# Patient Record
Sex: Male | Born: 1960 | Race: White | Hispanic: No | Marital: Married
Health system: Southern US, Community
[De-identification: ages and names within clinical notes are randomized; demographics above are authoritative.]

## PROBLEM LIST (undated history)

## (undated) ENCOUNTER — Emergency Department (HOSPITAL_COMMUNITY): Admission: EM | Payer: Self-pay

## (undated) DIAGNOSIS — J9621 Acute and chronic respiratory failure with hypoxia: Secondary | ICD-10-CM

## (undated) DIAGNOSIS — U071 COVID-19: Secondary | ICD-10-CM

## (undated) DIAGNOSIS — G4733 Obstructive sleep apnea (adult) (pediatric): Secondary | ICD-10-CM

## (undated) DIAGNOSIS — J1282 Pneumonia due to coronavirus disease 2019: Secondary | ICD-10-CM

## (undated) DIAGNOSIS — J449 Chronic obstructive pulmonary disease, unspecified: Secondary | ICD-10-CM

---

## 2020-04-10 ENCOUNTER — Inpatient Hospital Stay
Admission: RE | Admit: 2020-04-10 | Discharge: 2020-05-01 | Disposition: A | Discharge status: Rehabilitation Facility | Payer: Medicare Other | Attending: Internal Medicine | Admitting: Internal Medicine

## 2020-04-10 DIAGNOSIS — U071 COVID-19: Secondary | ICD-10-CM | POA: Diagnosis present

## 2020-04-10 DIAGNOSIS — J9690 Respiratory failure, unspecified, unspecified whether with hypoxia or hypercapnia: Secondary | ICD-10-CM

## 2020-04-10 DIAGNOSIS — L819 Disorder of pigmentation, unspecified: Secondary | ICD-10-CM

## 2020-04-10 DIAGNOSIS — J1282 Pneumonia due to coronavirus disease 2019: Secondary | ICD-10-CM | POA: Diagnosis present

## 2020-04-10 DIAGNOSIS — J449 Chronic obstructive pulmonary disease, unspecified: Secondary | ICD-10-CM | POA: Diagnosis present

## 2020-04-10 DIAGNOSIS — J9621 Acute and chronic respiratory failure with hypoxia: Secondary | ICD-10-CM | POA: Diagnosis present

## 2020-04-10 DIAGNOSIS — G4733 Obstructive sleep apnea (adult) (pediatric): Secondary | ICD-10-CM | POA: Diagnosis present

## 2020-04-10 HISTORY — DX: Acute and chronic respiratory failure with hypoxia: J96.21

## 2020-04-10 HISTORY — DX: COVID-19: U07.1

## 2020-04-10 HISTORY — DX: Chronic obstructive pulmonary disease, unspecified: J44.9

## 2020-04-10 HISTORY — DX: Obstructive sleep apnea (adult) (pediatric): G47.33

## 2020-04-10 HISTORY — DX: Pneumonia due to coronavirus disease 2019: J12.82

## 2020-04-11 ENCOUNTER — Encounter: Payer: Self-pay | Admitting: Internal Medicine

## 2020-04-11 ENCOUNTER — Other Ambulatory Visit (HOSPITAL_COMMUNITY): Payer: Self-pay

## 2020-04-11 DIAGNOSIS — J449 Chronic obstructive pulmonary disease, unspecified: Secondary | ICD-10-CM | POA: Diagnosis not present

## 2020-04-11 DIAGNOSIS — J1282 Pneumonia due to coronavirus disease 2019: Secondary | ICD-10-CM | POA: Diagnosis present

## 2020-04-11 DIAGNOSIS — J9621 Acute and chronic respiratory failure with hypoxia: Secondary | ICD-10-CM | POA: Diagnosis not present

## 2020-04-11 DIAGNOSIS — G4733 Obstructive sleep apnea (adult) (pediatric): Secondary | ICD-10-CM | POA: Diagnosis not present

## 2020-04-11 DIAGNOSIS — U071 COVID-19: Secondary | ICD-10-CM | POA: Diagnosis present

## 2020-04-11 LAB — CBC
HCT: 45.1 % (ref 39.0–52.0)
Hemoglobin: 13.2 g/dL (ref 13.0–17.0)
MCH: 24.4 pg — ABNORMAL LOW (ref 26.0–34.0)
MCHC: 29.3 g/dL — ABNORMAL LOW (ref 30.0–36.0)
MCV: 83.2 fL (ref 80.0–100.0)
Platelets: 148 10*3/uL — ABNORMAL LOW (ref 150–400)
RBC: 5.42 MIL/uL (ref 4.22–5.81)
RDW: 20.2 % — ABNORMAL HIGH (ref 11.5–15.5)
WBC: 13 10*3/uL — ABNORMAL HIGH (ref 4.0–10.5)
nRBC: 0 % (ref 0.0–0.2)

## 2020-04-11 LAB — COMPREHENSIVE METABOLIC PANEL
ALT: 37 U/L (ref 0–44)
AST: 33 U/L (ref 15–41)
Albumin: 3.3 g/dL — ABNORMAL LOW (ref 3.5–5.0)
Alkaline Phosphatase: 88 U/L (ref 38–126)
Anion gap: 12 (ref 5–15)
BUN: 7 mg/dL (ref 6–20)
CO2: 19 mmol/L — ABNORMAL LOW (ref 22–32)
Calcium: 8.7 mg/dL — ABNORMAL LOW (ref 8.9–10.3)
Chloride: 111 mmol/L (ref 98–111)
Creatinine, Ser: 0.88 mg/dL (ref 0.61–1.24)
GFR calc Af Amer: >60 mL/min (ref >60)
GFR calc non Af Amer: >60 mL/min (ref >60)
Glucose, Bld: 153 mg/dL — ABNORMAL HIGH (ref 70–99)
Potassium: 3.4 mmol/L — ABNORMAL LOW (ref 3.5–5.1)
Sodium: 142 mmol/L (ref 135–145)
Total Bilirubin: 0.6 mg/dL (ref 0.3–1.2)
Total Protein: 6 g/dL — ABNORMAL LOW (ref 6.5–8.1)

## 2020-04-11 LAB — BLOOD GAS, ARTERIAL
Acid-base deficit: 3.1 mmol/L — ABNORMAL HIGH (ref 0.0–2.0)
Bicarbonate: 21 mmol/L (ref 20.0–28.0)
FIO2: 60
O2 Saturation: 95.9 %
Patient temperature: 37
pCO2 arterial: 34.9 mmHg (ref 32.0–48.0)
pH, Arterial: 7.396 (ref 7.350–7.450)
pO2, Arterial: 77.2 mmHg — ABNORMAL LOW (ref 83.0–108.0)

## 2020-04-11 LAB — VANCOMYCIN, TROUGH: Vancomycin Tr: 11 ug/mL — ABNORMAL LOW (ref 15–20)

## 2020-04-11 LAB — PHOSPHORUS: Phosphorus: 3.2 mg/dL (ref 2.5–4.6)

## 2020-04-11 LAB — HEMOGLOBIN A1C
Hgb A1c MFr Bld: 6.4 % — ABNORMAL HIGH (ref 4.8–5.6)
Mean Plasma Glucose: 136.98 mg/dL

## 2020-04-11 LAB — MAGNESIUM: Magnesium: 2.2 mg/dL (ref 1.7–2.4)

## 2020-04-11 NOTE — Consult Note (Signed)
Pulmonary [place]  PULMONARY SERVICE  Date of Service: 04/11/2020  PULMONARY CRITICAL CARE Gerald Sanders  JOI:[id number]  DOB: 19-Feb-1961   DOA: 04/10/2020  Referring Physician: Merton Border, MD  HPI: Gerald Sanders is a 59 y.o. male seen for follow up of Acute on Chronic Respiratory Failure.  Patient has significant past medical history of OSA COPD hypertension obesity presented to the hospital because of being found unresponsive.  Patient apparently was significantly hypoxic with high oxygen requirements reports also tested for Covid and found to be positive.  Subsequently treated with the Covid protocol.  Was transferred to our facility for further management.  Significantly short of breath at this time  Review of Systems:  ROS performed and is unremarkable other than noted above.  Past medical history: OSA COPD Hypertension Obesity Chronic pain syndrome Fibromyalgia  Past surgical history: Back stimulator Cholecystectomy  Allergies: Reviewed LAR  Social history negative for tobacco alcohol drug abuse  Medications: Reviewed on Rounds  Physical Exam:  Vitals: Temperature is 99.2 pulse 78 respiratory 25 blood pressure is 158/85 saturations 97%  Ventilator Settings on 30 L high flow FiO2 60%  . General: Comfortable at this time . Eyes: Grossly normal lids, irises & conjunctiva . ENT: grossly tongue is normal . Neck: no obvious mass . Cardiovascular: S1-S2 normal no gallop or rub . Respiratory: No rhonchi no rales are noted at this time . Abdomen: Soft and nontender . Skin: no rash seen on limited exam . Musculoskeletal: not rigid . Psychiatric:unable to assess . Neurologic: no seizure no involuntary movements         Labs on Admission:  Basic Metabolic Panel: Recent Labs  Lab 04/11/20 1012  NA 142  K 3.4*  CL 111  CO2 19*  GLUCOSE 153*  BUN 7  CREATININE 0.88  CALCIUM 8.7*  MG 2.2  PHOS 3.2     Recent Labs  Lab 04/11/20 0246  PHART 7.396  PCO2ART 34.9  PO2ART 77.2*  HCO3 21.0  O2SAT 95.9    Liver Function Tests: Recent Labs  Lab 04/11/20 1012  AST 33  ALT 37  ALKPHOS 88  BILITOT 0.6  PROT 6.0*  ALBUMIN 3.3*   No results for input(s): LIPASE, AMYLASE in the last 168 hours. No results for input(s): AMMONIA in the last 168 hours.  CBC: Recent Labs  Lab 04/11/20 1012  WBC 13.0*  HGB 13.2  HCT 45.1  MCV 83.2  PLT 148*    Cardiac Enzymes: No results for input(s): CKTOTAL, CKMB, CKMBINDEX, TROPONINI in the last 168 hours.  BNP (last 3 results) No results for input(s): BNP in the last 8760 hours.  ProBNP (last 3 results) No results for input(s): PROBNP in the last 8760 hours.   Radiological Exams on Admission: DG CHEST PORT 1 VIEW  Result Date: 04/11/2020 CLINICAL DATA:  Respiratory failure EXAM: PORTABLE CHEST - 1 VIEW COMPARISON:  None available FINDINGS: Patchy airspace opacities scattered throughout the right lung. Hazy alveolar opacities at the left apex. Heart size normal for technique. No definite effusion. No pneumothorax. Dorsal epidural stimulator catheters extend up to the T7-8 interspace. Regional bones unremarkable. IMPRESSION: Asymmetric airspace disease, right greater than left. Electronically Signed   By: Lucrezia Europe M.D.   On: 04/11/2020 10:25    Assessment/Plan Active Problems:   Acute on chronic respiratory failure with hypoxia ([place])   COVID-19 virus infection   Pneumonia due to COVID-19 virus   COPD, severe ([place])  Obstructive sleep apnea   1. Acute on chronic respiratory failure hypoxia patient continues with high flow oxygen dependence.  Last chest x-ray still showing significant airspace disease right side greater than the left 2. COVID-19 pneumonia patient is in recovery phase however still has significant disease as noted with the high oxygen requirements. 3. COVID-19 virus infection resolved 4. Severe COPD by history  continue with medical management supportive care we will continue to follow along 5. Obstructive sleep apnea right now is on high flow oxygen will need CPAP once we are able to wean him down  I have personally seen and evaluated the patient, evaluated laboratory and imaging results, formulated the assessment and plan and placed orders. The Patient requires high complexity decision making with multiple systems involvement.  Case was discussed on Rounds with the Respiratory Therapy Director and the Respiratory staff Time Spent  Yevonne Pax, MD [facility] Pulmonary Critical Care Medicine Sleep Medicine

## 2020-04-12 DIAGNOSIS — J9621 Acute and chronic respiratory failure with hypoxia: Secondary | ICD-10-CM | POA: Diagnosis not present

## 2020-04-12 DIAGNOSIS — G4733 Obstructive sleep apnea (adult) (pediatric): Secondary | ICD-10-CM | POA: Diagnosis not present

## 2020-04-12 DIAGNOSIS — J449 Chronic obstructive pulmonary disease, unspecified: Secondary | ICD-10-CM | POA: Diagnosis not present

## 2020-04-12 DIAGNOSIS — U071 COVID-19: Secondary | ICD-10-CM | POA: Diagnosis not present

## 2020-04-12 LAB — BLOOD GAS, ARTERIAL
Acid-base deficit: 4 mmol/L — ABNORMAL HIGH (ref 0.0–2.0)
Bicarbonate: 20.1 mmol/L (ref 20.0–28.0)
FIO2: 50
O2 Saturation: 99.1 %
Patient temperature: 36.4
pCO2 arterial: 33.1 mmHg (ref 32.0–48.0)
pH, Arterial: 7.398 (ref 7.350–7.450)
pO2, Arterial: 135 mmHg — ABNORMAL HIGH (ref 83.0–108.0)

## 2020-04-12 LAB — BASIC METABOLIC PANEL
Anion gap: 11 (ref 5–15)
BUN: 12 mg/dL (ref 6–20)
CO2: 20 mmol/L — ABNORMAL LOW (ref 22–32)
Calcium: 8.7 mg/dL — ABNORMAL LOW (ref 8.9–10.3)
Chloride: 114 mmol/L — ABNORMAL HIGH (ref 98–111)
Creatinine, Ser: 0.8 mg/dL (ref 0.61–1.24)
GFR calc Af Amer: >60 mL/min (ref >60)
GFR calc non Af Amer: >60 mL/min (ref >60)
Glucose, Bld: 171 mg/dL — ABNORMAL HIGH (ref 70–99)
Potassium: 3.7 mmol/L (ref 3.5–5.1)
Sodium: 145 mmol/L (ref 135–145)

## 2020-04-12 LAB — EXPECTORATED SPUTUM ASSESSMENT W GRAM STAIN, RFLX TO RESP C

## 2020-04-12 NOTE — Progress Notes (Signed)
Pulmonary Critical Care Medicine [facility] [place]   PULMONARY CRITICAL CARE SERVICE  PROGRESS NOTE  Date of Service: 04/12/2020  Gerald Sanders  OJJ:[id number]  DOB: January 02, 1961   DOA: 04/10/2020  Referring Physician: Carron Curie, MD  HPI: Gerald Sanders is a 59 y.o. male seen for follow up of Acute on Chronic Respiratory Failure.  Patient still on high flow nasal cannula trying to wean FiO2 down  Medications: Reviewed on Rounds  Physical Exam:  Vitals: Temperature is 96.6 pulse 72 respiratory rate 16 blood pressure 137/70 saturations 97%  Ventilator Settings off the ventilator on high flow nasal cannula   General: Comfortable at this time  Eyes: Grossly normal lids, irises & conjunctiva  ENT: grossly tongue is normal  Neck: no obvious mass  Cardiovascular: S1 S2 normal no gallop  Respiratory: Scattered rhonchi expansion is equal  Abdomen: soft  Skin: no rash seen on limited exam  Musculoskeletal: not rigid  Psychiatric:unable to assess  Neurologic: no seizure no involuntary movements         Lab Data:   Basic Metabolic Panel: Recent Labs  Lab 04/11/20 1012 04/12/20 0801  NA 142 145  K 3.4* 3.7  CL 111 114*  CO2 19* 20*  GLUCOSE 153* 171*  BUN 7 12  CREATININE 0.88 0.80  CALCIUM 8.7* 8.7*  MG 2.2  --   PHOS 3.2  --     ABG: Recent Labs  Lab 04/11/20 0246 04/12/20 0615  PHART 7.396 7.398  PCO2ART 34.9 33.1  PO2ART 77.2* 135*  HCO3 21.0 20.1  O2SAT 95.9 99.1    Liver Function Tests: Recent Labs  Lab 04/11/20 1012  AST 33  ALT 37  ALKPHOS 88  BILITOT 0.6  PROT 6.0*  ALBUMIN 3.3*   No results for input(s): LIPASE, AMYLASE in the last 168 hours. No results for input(s): AMMONIA in the last 168 hours.  CBC: Recent Labs  Lab 04/11/20 1012  WBC 13.0*  HGB 13.2  HCT 45.1  MCV 83.2  PLT 148*    Cardiac Enzymes: No results for input(s): CKTOTAL, CKMB, CKMBINDEX, TROPONINI in the last 168 hours.  BNP (last 3  results) No results for input(s): BNP in the last 8760 hours.  ProBNP (last 3 results) No results for input(s): PROBNP in the last 8760 hours.  Radiological Exams: DG CHEST PORT 1 VIEW  Result Date: 04/11/2020 CLINICAL DATA:  Respiratory failure EXAM: PORTABLE CHEST - 1 VIEW COMPARISON:  None available FINDINGS: Patchy airspace opacities scattered throughout the right lung. Hazy alveolar opacities at the left apex. Heart size normal for technique. No definite effusion. No pneumothorax. Dorsal epidural stimulator catheters extend up to the T7-8 interspace. Regional bones unremarkable. IMPRESSION: Asymmetric airspace disease, right greater than left. Electronically Signed   By: Corlis Leak M.D.   On: 04/11/2020 10:25    Assessment/Plan Active Problems:   Acute on chronic respiratory failure with hypoxia (HCC)   COVID-19 virus infection   Pneumonia due to COVID-19 virus   COPD, severe (HCC)   Obstructive sleep apnea   1. Acute on chronic respiratory failure hypoxia we will continue oxygen therapy titrate down as tolerated 2. COVID-19 virus infection resolved 3. Pneumonia due to COVID-19 improving slowly with asymmetric airspace disease on the chest x-ray. 4. Severe COPD medical management 5. OSA currently patient is on high flow oxygen   I have personally seen and evaluated the patient, evaluated laboratory and imaging results, formulated the assessment and plan and placed orders. The Patient requires  high complexity decision making with multiple systems involvement.  Rounds were done with the Respiratory Therapy Director and Staff therapists and discussed with nursing staff also.  Allyne Gee, MD [facility] Pulmonary Critical Care Medicine Sleep Medicine

## 2020-04-13 ENCOUNTER — Encounter (HOSPITAL_BASED_OUTPATIENT_CLINIC_OR_DEPARTMENT_OTHER): Payer: Self-pay

## 2020-04-13 DIAGNOSIS — J9621 Acute and chronic respiratory failure with hypoxia: Secondary | ICD-10-CM | POA: Diagnosis not present

## 2020-04-13 DIAGNOSIS — L0390 Cellulitis, unspecified: Secondary | ICD-10-CM

## 2020-04-13 DIAGNOSIS — U071 COVID-19: Secondary | ICD-10-CM | POA: Diagnosis not present

## 2020-04-13 DIAGNOSIS — J449 Chronic obstructive pulmonary disease, unspecified: Secondary | ICD-10-CM | POA: Diagnosis not present

## 2020-04-13 DIAGNOSIS — G4733 Obstructive sleep apnea (adult) (pediatric): Secondary | ICD-10-CM | POA: Diagnosis not present

## 2020-04-13 LAB — VANCOMYCIN, TROUGH: Vancomycin Tr: 24 ug/mL (ref 15–20)

## 2020-04-13 NOTE — Progress Notes (Signed)
VASCULAR LAB PRELIMINARY  PRELIMINARY  PRELIMINARY  PRELIMINARY  ABIs completed.    Preliminary report:  See CV proc for preliminary results.  Dnasia Gauna, RVT 04/13/2020, 12:28 PM

## 2020-04-13 NOTE — Progress Notes (Signed)
Pulmonary Critical Care Medicine [facility] [place]   PULMONARY CRITICAL CARE SERVICE  PROGRESS NOTE  Date of Service: 04/13/2020  Gerald Sanders  GMW:[id number]  DOB: 01-01-1961   DOA: 04/10/2020  Referring Physician: Carron Curie, MD  HPI: Gerald Sanders is a 59 y.o. male seen for follow up of Acute on Chronic Respiratory Failure.  Patient is on heated high flow appears to be comfortable 30 L  Medications: Reviewed on Rounds  Physical Exam:  Vitals: Temperature is 98.3 pulse 67 respiratory 16 blood pressures 144/94 saturations 97%  Ventilator Settings on heated high flow  . General: Comfortable at this time . Eyes: Grossly normal lids, irises & conjunctiva . ENT: grossly tongue is normal . Neck: no obvious mass . Cardiovascular: S1 S2 normal no gallop . Respiratory: No rhonchi coarse breath sounds . Abdomen: soft . Skin: no rash seen on limited exam . Musculoskeletal: not rigid . Psychiatric:unable to assess . Neurologic: no seizure no involuntary movements         Lab Data:   Basic Metabolic Panel: Recent Labs  Lab 04/11/20 1012 04/12/20 0801  NA 142 145  K 3.4* 3.7  CL 111 114*  CO2 19* 20*  GLUCOSE 153* 171*  BUN 7 12  CREATININE 0.88 0.80  CALCIUM 8.7* 8.7*  MG 2.2  --   PHOS 3.2  --     ABG: Recent Labs  Lab 04/11/20 0246 04/12/20 0615  PHART 7.396 7.398  PCO2ART 34.9 33.1  PO2ART 77.2* 135*  HCO3 21.0 20.1  O2SAT 95.9 99.1    Liver Function Tests: Recent Labs  Lab 04/11/20 1012  AST 33  ALT 37  ALKPHOS 88  BILITOT 0.6  PROT 6.0*  ALBUMIN 3.3*   No results for input(s): LIPASE, AMYLASE in the last 168 hours. No results for input(s): AMMONIA in the last 168 hours.  CBC: Recent Labs  Lab 04/11/20 1012  WBC 13.0*  HGB 13.2  HCT 45.1  MCV 83.2  PLT 148*    Cardiac Enzymes: No results for input(s): CKTOTAL, CKMB, CKMBINDEX, TROPONINI in the last 168 hours.  BNP (last 3 results) No results for input(s): BNP in the  last 8760 hours.  ProBNP (last 3 results) No results for input(s): PROBNP in the last 8760 hours.  Radiological Exams:   Assessment/Plan Active Problems:   Acute on chronic respiratory failure with hypoxia (HCC)   COVID-19 virus infection   Pneumonia due to COVID-19 virus   COPD, severe (HCC)   Obstructive sleep apnea   1. Acute on chronic respiratory failure hypoxia we will continue with oxygen therapy heated high flow.  Continue pulmonary toilet supportive care 2. COVID-19 virus infection in resolution phase 3. Pneumonia due to COVID-19 resolving improving 4. Severe COPD at baseline we will continue to follow 5. OSA nonissue right now because of the high flow   I have personally seen and evaluated the patient, evaluated laboratory and imaging results, formulated the assessment and plan and placed orders. The Patient requires high complexity decision making with multiple systems involvement.  Rounds were done with the Respiratory Therapy Director and Staff therapists and discussed with nursing staff also.  Yevonne Pax, MD [facility] Pulmonary Critical Care Medicine Sleep Medicine

## 2020-04-13 NOTE — Consult Note (Signed)
Critical Limb Ischemia Navigator consult acknowledged and chart reviewed.  I was able to visit with Mr Maynez at bedside in LTAC location to introduce myself and my role. I was consulted due to discoloration to Mr Mendiola's bilateral great toes.  Mr Teegarden found sitting up in bed, alert and oriented. He is COVID positive- all precautions observed.  Patient was admitted on  04/10/20 with acute on chronic respiratory failure, found unresponsive at home. He now says he is doing much better, however has noted this discoloration to his bilateral great toes. Denies any trauma to toes. Non smoker. PMH of COPD and HTN. Denies diabetes. Assessment reveals skin intact bilateral lower extremities, no ulcerations. Patient does have protective Mepilex dressings bilateral heels per [facility] prevention protocol. Bilateral great toes slightly cyanotic in color and cool to touch in reference to other toes. No swelling or erythema. Unable to assess blanching due to thick nails. I was able to palpate light pedal pulses bilateral feet. Patient says the discoloration showed up after his hospitalization and he has never had this problem before. No discoloration noted to fingertips or hands. He says toes feel like they have been "frost bitten" or like there are tiny needles in his toes suggestive that this may potentially be a case of "COVID Toes" phenomenon.  VAS [place] ABI w/wo TBI order is in place  Will follow pending these results to determine further needs for vascular consult.  Thank you for the opportunity to see this gentleman.  Hilma Favors RN BSN CWS CLI [employer] [phone]

## 2020-04-14 DIAGNOSIS — J449 Chronic obstructive pulmonary disease, unspecified: Secondary | ICD-10-CM | POA: Diagnosis not present

## 2020-04-14 DIAGNOSIS — U071 COVID-19: Secondary | ICD-10-CM | POA: Diagnosis not present

## 2020-04-14 DIAGNOSIS — G4733 Obstructive sleep apnea (adult) (pediatric): Secondary | ICD-10-CM | POA: Diagnosis not present

## 2020-04-14 DIAGNOSIS — J9621 Acute and chronic respiratory failure with hypoxia: Secondary | ICD-10-CM | POA: Diagnosis not present

## 2020-04-14 LAB — CULTURE, RESPIRATORY W GRAM STAIN: Gram Stain: NONE SEEN

## 2020-04-14 LAB — LITHIUM LEVEL: Lithium Lvl: 0.39 mmol/L — ABNORMAL LOW (ref 0.60–1.20)

## 2020-04-14 LAB — TSH: TSH: 0.424 u[IU]/mL (ref 0.350–4.500)

## 2020-04-14 NOTE — Progress Notes (Signed)
Pulmonary Critical Care Medicine [facility] [place]   PULMONARY CRITICAL CARE SERVICE  PROGRESS NOTE  Date of Service: 04/14/2020  Udell Mazzocco  [id number]  DOB: [birth year]   DOA: 04/10/2020  Referring Physician: Carron Curie, MD  HPI: Kessler Solly is a 59 y.o. male seen for follow up of Acute on Chronic Respiratory Failure.  Patient is down to 10 L of O2 on the Oxymizer off of high flow oxygen for now  Medications: Reviewed on Rounds  Physical Exam:  Vitals: Temperature 98.0 pulse 75 respiratory rate 16 blood pressure is 94/69 saturations 98%  Ventilator Settings on 10 L Oxymizer  . General: Comfortable at this time . Eyes: Grossly normal lids, irises & conjunctiva . ENT: grossly tongue is normal . Neck: no obvious mass . Cardiovascular: S1 S2 normal no gallop . Respiratory: No rhonchi no rales . Abdomen: soft . Skin: no rash seen on limited exam . Musculoskeletal: not rigid . Psychiatric:unable to assess . Neurologic: no seizure no involuntary movements         Lab Data:   Basic Metabolic Panel: Recent Labs  Lab 04/11/20 1012 04/12/20 0801  NA 142 145  K 3.4* 3.7  CL 111 114*  CO2 19* 20*  GLUCOSE 153* 171*  BUN 7 12  CREATININE 0.88 0.80  CALCIUM 8.7* 8.7*  MG 2.2  --   PHOS 3.2  --     ABG: Recent Labs  Lab 04/11/20 0246 04/12/20 0615  PHART 7.396 7.398  PCO2ART 34.9 33.1  PO2ART 77.2* 135*  HCO3 21.0 20.1  O2SAT 95.9 99.1    Liver Function Tests: Recent Labs  Lab 04/11/20 1012  AST 33  ALT 37  ALKPHOS 88  BILITOT 0.6  PROT 6.0*  ALBUMIN 3.3*   No results for input(s): LIPASE, AMYLASE in the last 168 hours. No results for input(s): AMMONIA in the last 168 hours.  CBC: Recent Labs  Lab 04/11/20 1012  WBC 13.0*  HGB 13.2  HCT 45.1  MCV 83.2  PLT 148*    Cardiac Enzymes: No results for input(s): CKTOTAL, CKMB, CKMBINDEX, TROPONINI in the last 168 hours.  BNP (last 3 results) No results for input(s): BNP in  the last 8760 hours.  ProBNP (last 3 results) No results for input(s): PROBNP in the last 8760 hours.  Radiological Exams:   Assessment/Plan Active Problems:   Acute on chronic respiratory failure with hypoxia (HCC)   COVID-19 virus infection   Pneumonia due to COVID-19 virus   COPD, severe (HCC)   Obstructive sleep apnea   1. Acute on chronic respiratory failure hypoxia we will continue with the weaning FiO2 down as tolerated. 2. COVID-19 virus infection resolved 3. Pneumonia due to COVID-19 treated clinically improving 4. Severe COPD at baseline 5. Obstructive sleep apnea currently is on oxygen therapy   I have personally seen and evaluated the patient, evaluated laboratory and imaging results, formulated the assessment and plan and placed orders. The Patient requires high complexity decision making with multiple systems involvement.  Rounds were done with the Respiratory Therapy Director and Staff therapists and discussed with nursing staff also.  Yevonne Pax, MD [facility] Pulmonary Critical Care Medicine Sleep Medicine

## 2020-04-15 DIAGNOSIS — U071 COVID-19: Secondary | ICD-10-CM | POA: Diagnosis not present

## 2020-04-15 DIAGNOSIS — J449 Chronic obstructive pulmonary disease, unspecified: Secondary | ICD-10-CM | POA: Diagnosis not present

## 2020-04-15 DIAGNOSIS — J9621 Acute and chronic respiratory failure with hypoxia: Secondary | ICD-10-CM | POA: Diagnosis not present

## 2020-04-15 DIAGNOSIS — G4733 Obstructive sleep apnea (adult) (pediatric): Secondary | ICD-10-CM | POA: Diagnosis not present

## 2020-04-15 NOTE — Progress Notes (Signed)
Pulmonary Critical Care Medicine [facility] [place]   PULMONARY CRITICAL CARE SERVICE  PROGRESS NOTE  Date of Service: 04/15/2020  Adir Schicker  ZJQ:[id number]  DOB: 10-09-1961   DOA: 04/10/2020  Referring Physician: Carron Curie, MD  HPI: Red Mandt is a 59 y.o. male seen for follow up of Acute on Chronic Respiratory Failure.  Oxygen is slowly being decreased patient is now down to 3 L Oxymizer  Medications: Reviewed on Rounds  Physical Exam:  Vitals: Temperature 97.4 pulse 96 respiratory rate 16 blood pressure is 114/76 saturations 96%  Ventilator Settings off the ventilator on 3 L O2  . General: Comfortable at this time . Eyes: Grossly normal lids, irises & conjunctiva . ENT: grossly tongue is normal . Neck: no obvious mass . Cardiovascular: S1 S2 normal no gallop . Respiratory: No rhonchi no rales are noted at this time . Abdomen: soft . Skin: no rash seen on limited exam . Musculoskeletal: not rigid . Psychiatric:unable to assess . Neurologic: no seizure no involuntary movements         Lab Data:   Basic Metabolic Panel: Recent Labs  Lab 04/11/20 1012 04/12/20 0801  NA 142 145  K 3.4* 3.7  CL 111 114*  CO2 19* 20*  GLUCOSE 153* 171*  BUN 7 12  CREATININE 0.88 0.80  CALCIUM 8.7* 8.7*  MG 2.2  --   PHOS 3.2  --     ABG: Recent Labs  Lab 04/11/20 0246 04/12/20 0615  PHART 7.396 7.398  PCO2ART 34.9 33.1  PO2ART 77.2* 135*  HCO3 21.0 20.1  O2SAT 95.9 99.1    Liver Function Tests: Recent Labs  Lab 04/11/20 1012  AST 33  ALT 37  ALKPHOS 88  BILITOT 0.6  PROT 6.0*  ALBUMIN 3.3*   No results for input(s): LIPASE, AMYLASE in the last 168 hours. No results for input(s): AMMONIA in the last 168 hours.  CBC: Recent Labs  Lab 04/11/20 1012  WBC 13.0*  HGB 13.2  HCT 45.1  MCV 83.2  PLT 148*    Cardiac Enzymes: No results for input(s): CKTOTAL, CKMB, CKMBINDEX, TROPONINI in the last 168 hours.  BNP (last 3 results) No  results for input(s): BNP in the last 8760 hours.  ProBNP (last 3 results) No results for input(s): PROBNP in the last 8760 hours.  Radiological Exams: No results found.  Assessment/Plan Active Problems:   Acute on chronic respiratory failure with hypoxia (HCC)   COVID-19 virus infection   Pneumonia due to COVID-19 virus   COPD, severe (HCC)   Obstructive sleep apnea   1. Acute on chronic respiratory failure hypoxia oxygen requirement slowly improving right now on 3 L O2 2. COVID-19 virus infection treated resolved 3. Pneumonia due to COVID-19 improving 4. Severe COPD at baseline we will continue present management 5. Obstructive sleep apnea no change   I have personally seen and evaluated the patient, evaluated laboratory and imaging results, formulated the assessment and plan and placed orders. The Patient requires high complexity decision making with multiple systems involvement.  Rounds were done with the Respiratory Therapy Director and Staff therapists and discussed with nursing staff also.  Yevonne Pax, MD [facility] Pulmonary Critical Care Medicine Sleep Medicine

## 2020-04-16 DIAGNOSIS — J9621 Acute and chronic respiratory failure with hypoxia: Secondary | ICD-10-CM | POA: Diagnosis not present

## 2020-04-16 DIAGNOSIS — G4733 Obstructive sleep apnea (adult) (pediatric): Secondary | ICD-10-CM | POA: Diagnosis not present

## 2020-04-16 DIAGNOSIS — U071 COVID-19: Secondary | ICD-10-CM | POA: Diagnosis not present

## 2020-04-16 DIAGNOSIS — J449 Chronic obstructive pulmonary disease, unspecified: Secondary | ICD-10-CM | POA: Diagnosis not present

## 2020-04-16 NOTE — Progress Notes (Signed)
Pulmonary Critical Care Medicine [facility] [place]   PULMONARY CRITICAL CARE SERVICE  PROGRESS NOTE  Date of Service: 04/16/2020  Gerald Sanders  RCV:[id number]  DOB: 12/13/1960   DOA: 04/10/2020  Referring Physician: Carron Curie, MD  HPI: Gerald Sanders is a 59 y.o. male seen for follow up of Acute on Chronic Respiratory Failure.  Patient is currently on 3 L oxygen good saturations are noted  Medications: Reviewed on Rounds  Physical Exam:  Vitals: Temperature 96.4 pulse 79 respiratory rate 29 blood pressure is 140/84 saturations 100%  Ventilator Settings on 3 L O2  . General: Comfortable at this time . Eyes: Grossly normal lids, irises & conjunctiva . ENT: grossly tongue is normal . Neck: no obvious mass . Cardiovascular: S1 S2 normal no gallop . Respiratory: No rhonchi coarse breath sounds . Abdomen: soft . Skin: no rash seen on limited exam . Musculoskeletal: not rigid . Psychiatric:unable to assess . Neurologic: no seizure no involuntary movements         Lab Data:   Basic Metabolic Panel: Recent Labs  Lab 04/11/20 1012 04/12/20 0801  NA 142 145  K 3.4* 3.7  CL 111 114*  CO2 19* 20*  GLUCOSE 153* 171*  BUN 7 12  CREATININE 0.88 0.80  CALCIUM 8.7* 8.7*  MG 2.2  --   PHOS 3.2  --     ABG: Recent Labs  Lab 04/11/20 0246 04/12/20 0615  PHART 7.396 7.398  PCO2ART 34.9 33.1  PO2ART 77.2* 135*  HCO3 21.0 20.1  O2SAT 95.9 99.1    Liver Function Tests: Recent Labs  Lab 04/11/20 1012  AST 33  ALT 37  ALKPHOS 88  BILITOT 0.6  PROT 6.0*  ALBUMIN 3.3*   No results for input(s): LIPASE, AMYLASE in the last 168 hours. No results for input(s): AMMONIA in the last 168 hours.  CBC: Recent Labs  Lab 04/11/20 1012  WBC 13.0*  HGB 13.2  HCT 45.1  MCV 83.2  PLT 148*    Cardiac Enzymes: No results for input(s): CKTOTAL, CKMB, CKMBINDEX, TROPONINI in the last 168 hours.  BNP (last 3 results) No results for input(s): BNP in the last  8760 hours.  ProBNP (last 3 results) No results for input(s): PROBNP in the last 8760 hours.  Radiological Exams: No results found.  Assessment/Plan Active Problems:   Acute on chronic respiratory failure with hypoxia (HCC)   COVID-19 virus infection   Pneumonia due to COVID-19 virus   COPD, severe (HCC)   Obstructive sleep apnea   1. Acute on chronic respiratory failure hypoxia we will continue with oxygen therapy titrate as tolerated continue pulmonary toilet. 2. COVID-19 virus infection in resolution phase we will continue to monitor. 3. Pneumonia due to COVID-19 treated we will continue to follow 4. Severe COPD patient is at baseline at this time. 5. Obstructive sleep apnea no change we will continue to follow   I have personally seen and evaluated the patient, evaluated laboratory and imaging results, formulated the assessment and plan and placed orders. The Patient requires high complexity decision making with multiple systems involvement.  Rounds were done with the Respiratory Therapy Director and Staff therapists and discussed with nursing staff also.  Yevonne Pax, MD [facility] Pulmonary Critical Care Medicine Sleep Medicine

## 2020-04-17 ENCOUNTER — Institutional Professional Consult (permissible substitution) (HOSPITAL_COMMUNITY): Payer: Self-pay

## 2020-04-17 DIAGNOSIS — G4733 Obstructive sleep apnea (adult) (pediatric): Secondary | ICD-10-CM | POA: Diagnosis not present

## 2020-04-17 DIAGNOSIS — J9621 Acute and chronic respiratory failure with hypoxia: Secondary | ICD-10-CM | POA: Diagnosis not present

## 2020-04-17 DIAGNOSIS — J449 Chronic obstructive pulmonary disease, unspecified: Secondary | ICD-10-CM | POA: Diagnosis not present

## 2020-04-17 DIAGNOSIS — U071 COVID-19: Secondary | ICD-10-CM | POA: Diagnosis not present

## 2020-04-17 LAB — BASIC METABOLIC PANEL
Anion gap: 9 (ref 5–15)
BUN: 35 mg/dL — ABNORMAL HIGH (ref 6–20)
CO2: 22 mmol/L (ref 22–32)
Calcium: 9.7 mg/dL (ref 8.9–10.3)
Chloride: 99 mmol/L (ref 98–111)
Creatinine, Ser: 0.89 mg/dL (ref 0.61–1.24)
GFR calc Af Amer: >60 mL/min (ref >60)
GFR calc non Af Amer: >60 mL/min (ref >60)
Glucose, Bld: 204 mg/dL — ABNORMAL HIGH (ref 70–99)
Potassium: 4.8 mmol/L (ref 3.5–5.1)
Sodium: 130 mmol/L — ABNORMAL LOW (ref 135–145)

## 2020-04-17 LAB — MAGNESIUM: Magnesium: 2.4 mg/dL (ref 1.7–2.4)

## 2020-04-17 NOTE — Progress Notes (Addendum)
Pulmonary Critical Care Medicine [facility] [place]   PULMONARY CRITICAL CARE SERVICE  PROGRESS NOTE  Date of Service: 04/17/2020  Tiger Spieker  HMC:[id number]  DOB: Apr 29, 1961   DOA: 04/10/2020  Referring Physician: Carron Curie, MD  HPI: Gerald Sanders is a 59 y.o. male seen for follow up of Acute on Chronic Respiratory Failure.  Patient 4 L satting well no fever or distress at this time.  Medications: Reviewed on Rounds  Physical Exam:  Vitals: Pulse 97 respirations 18 BP 150/74 O2 sat 98% temp 98.0  Ventilator Settings 4 L nasal cannula  . General: Comfortable at this time . Eyes: Grossly normal lids, irises & conjunctiva . ENT: grossly tongue is normal . Neck: no obvious mass . Cardiovascular: S1 S2 normal no gallop . Respiratory: No rales or rhonchi noted . Abdomen: soft . Skin: no rash seen on limited exam . Musculoskeletal: not rigid . Psychiatric:unable to assess . Neurologic: no seizure no involuntary movements         Lab Data:   Basic Metabolic Panel: Recent Labs  Lab 04/11/20 1012 04/12/20 0801 04/17/20 1818  NA 142 145 130*  K 3.4* 3.7 4.8  CL 111 114* 99  CO2 19* 20* 22  GLUCOSE 153* 171* 204*  BUN 7 12 35*  CREATININE 0.88 0.80 0.89  CALCIUM 8.7* 8.7* 9.7  MG 2.2  --  2.4  PHOS 3.2  --   --     ABG: Recent Labs  Lab 04/11/20 0246 04/12/20 0615  PHART 7.396 7.398  PCO2ART 34.9 33.1  PO2ART 77.2* 135*  HCO3 21.0 20.1  O2SAT 95.9 99.1    Liver Function Tests: Recent Labs  Lab 04/11/20 1012  AST 33  ALT 37  ALKPHOS 88  BILITOT 0.6  PROT 6.0*  ALBUMIN 3.3*   No results for input(s): LIPASE, AMYLASE in the last 168 hours. No results for input(s): AMMONIA in the last 168 hours.  CBC: Recent Labs  Lab 04/11/20 1012  WBC 13.0*  HGB 13.2  HCT 45.1  MCV 83.2  PLT 148*    Cardiac Enzymes: No results for input(s): CKTOTAL, CKMB, CKMBINDEX, TROPONINI in the last 168 hours.  BNP (last 3 results) No results for  input(s): BNP in the last 8760 hours.  ProBNP (last 3 results) No results for input(s): PROBNP in the last 8760 hours.  Radiological Exams: No results found.  Assessment/Plan Active Problems:   Acute on chronic respiratory failure with hypoxia (HCC)   COVID-19 virus infection   Pneumonia due to COVID-19 virus   COPD, severe (HCC)   Obstructive sleep apnea   1. Acute on chronic respiratory failure hypoxia we will continue with oxygen therapy titrate as tolerated continue pulmonary toilet. 2. COVID-19 virus infection in resolution phase we will continue to monitor. 3. Pneumonia due to COVID-19 treated we will continue to follow 4. Severe COPD patient is at baseline at this time. 5. Obstructive sleep apnea no change we will continue to follow   I have personally seen and evaluated the patient, evaluated laboratory and imaging results, formulated the assessment and plan and placed orders. The Patient requires high complexity decision making with multiple systems involvement.  Rounds were done with the Respiratory Therapy Director and Staff therapists and discussed with nursing staff also.  Yevonne Pax, MD [facility] Pulmonary Critical Care Medicine Sleep Medicine

## 2020-04-18 ENCOUNTER — Encounter (HOSPITAL_BASED_OUTPATIENT_CLINIC_OR_DEPARTMENT_OTHER): Payer: HRSA Program

## 2020-04-18 DIAGNOSIS — G4733 Obstructive sleep apnea (adult) (pediatric): Secondary | ICD-10-CM | POA: Diagnosis not present

## 2020-04-18 DIAGNOSIS — J9621 Acute and chronic respiratory failure with hypoxia: Secondary | ICD-10-CM | POA: Diagnosis not present

## 2020-04-18 DIAGNOSIS — J449 Chronic obstructive pulmonary disease, unspecified: Secondary | ICD-10-CM | POA: Diagnosis not present

## 2020-04-18 DIAGNOSIS — U071 COVID-19: Secondary | ICD-10-CM | POA: Diagnosis not present

## 2020-04-18 NOTE — Progress Notes (Signed)
Pulmonary Critical Care Medicine [place]   PULMONARY CRITICAL CARE SERVICE  PROGRESS NOTE  Date of Service: 04/18/2020  Gerald Sanders  UKG:[id number]  DOB: [birth year]   DOA: 04/10/2020  Referring Physician: Merton Border, MD  HPI: Gerald Sanders is a 59 y.o. male seen for follow up of Acute on Chronic Respiratory Failure.  Patient is on 3 L O2 good saturations are noted at this time  Medications: Reviewed on Rounds  Physical Exam:  Vitals: Temperature is 96.6 pulse 95 respiratory rate 16 blood pressure is 117/77 saturations 99%  Ventilator Settings on 3 L O2  . General: Comfortable at this time . Eyes: Grossly normal lids, irises & conjunctiva . ENT: grossly tongue is normal . Neck: no obvious mass . Cardiovascular: S1 S2 normal no gallop . Respiratory: No rhonchi no rales are noted at this time . Abdomen: soft . Skin: no rash seen on limited exam . Musculoskeletal: not rigid . Psychiatric:unable to assess . Neurologic: no seizure no involuntary movements         Lab Data:   Basic Metabolic Panel: Recent Labs  Lab 04/12/20 0801 04/17/20 1818  NA 145 130*  K 3.7 4.8  CL 114* 99  CO2 20* 22  GLUCOSE 171* 204*  BUN 12 35*  CREATININE 0.80 0.89  CALCIUM 8.7* 9.7  MG  --  2.4    ABG: Recent Labs  Lab 04/12/20 0615  PHART 7.398  PCO2ART 33.1  PO2ART 135*  HCO3 20.1  O2SAT 99.1    Liver Function Tests: No results for input(s): AST, ALT, ALKPHOS, BILITOT, PROT, ALBUMIN in the last 168 hours. No results for input(s): LIPASE, AMYLASE in the last 168 hours. No results for input(s): AMMONIA in the last 168 hours.  CBC: No results for input(s): WBC, NEUTROABS, HGB, HCT, MCV, PLT in the last 168 hours.  Cardiac Enzymes: No results for input(s): CKTOTAL, CKMB, CKMBINDEX, TROPONINI in the last 168 hours.  BNP (last 3 results) No results for input(s): BNP in the last 8760 hours.  ProBNP (last 3 results) No results for input(s): PROBNP in  the last 8760 hours.  Radiological Exams: VAS [place] LOWER EXTREMITY ARTERIAL DUPLEX  Result Date: 04/18/2020 LOWER EXTREMITY ARTERIAL DUPLEX STUDY Indications: COVID with dusky toes. Other Factors: Leg pain.  Current ABI: 04/13/20 noncompressible ABIs Performing Technologist: June Leap RDMS, RVT  Examination Guidelines: A complete evaluation includes B-mode imaging, spectral Doppler, color Doppler, and power Doppler as needed of all accessible portions of each vessel. Bilateral testing is considered an integral part of a complete examination. Limited examinations for reoccurring indications may be performed as noted.  +-----------+--------+-----+--------+---------+--------+ RIGHT      PSV cm/sRatioStenosisWaveform Comments +-----------+--------+-----+--------+---------+--------+ CFA Prox   66                   triphasic         +-----------+--------+-----+--------+---------+--------+ DFA        183                  triphasic         +-----------+--------+-----+--------+---------+--------+ SFA Prox   76                   triphasic         +-----------+--------+-----+--------+---------+--------+ SFA Mid    83                   triphasic         +-----------+--------+-----+--------+---------+--------+ SFA  Distal 73                   triphasic         +-----------+--------+-----+--------+---------+--------+ POP Prox   40                   triphasic         +-----------+--------+-----+--------+---------+--------+ POP Distal 45                   triphasic         +-----------+--------+-----+--------+---------+--------+ ATA Prox   18                   biphasic          +-----------+--------+-----+--------+---------+--------+ ATA Mid                 occluded                  +-----------+--------+-----+--------+---------+--------+ ATA Distal              occluded                  +-----------+--------+-----+--------+---------+--------+ PTA  Distal 49                   triphasic         +-----------+--------+-----+--------+---------+--------+ PERO Distal18                   triphasic         +-----------+--------+-----+--------+---------+--------+ DP         18                                     +-----------+--------+-----+--------+---------+--------+  +-----------+--------+-----+--------+----------+--------------+ LEFT       PSV cm/sRatioStenosisWaveform  Comments       +-----------+--------+-----+--------+----------+--------------+ CFA Prox   65                   triphasic                +-----------+--------+-----+--------+----------+--------------+ DFA        79                   triphasic                +-----------+--------+-----+--------+----------+--------------+ SFA Prox   91                   triphasic                +-----------+--------+-----+--------+----------+--------------+ SFA Mid    77                   triphasic                +-----------+--------+-----+--------+----------+--------------+ SFA Distal 102                  triphasic                +-----------+--------+-----+--------+----------+--------------+ POP Prox   53                   triphasic                +-----------+--------+-----+--------+----------+--------------+ POP Distal 41                   triphasic                +-----------+--------+-----+--------+----------+--------------+  ATA Distal 51                   triphasic                +-----------+--------+-----+--------+----------+--------------+ PTA Prox                occluded                         +-----------+--------+-----+--------+----------+--------------+ PTA Mid    8                    monophasicnear occlusion +-----------+--------+-----+--------+----------+--------------+ PTA Distal 9                    monophasicnear occlusion +-----------+--------+-----+--------+----------+--------------+ PERO  Distal27                   triphasic                +-----------+--------+-----+--------+----------+--------------+   Summary: Right: 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 Triphasic, normal flow noted in all other vessels. Left: Total occlusion noted in the posterior tibial artery. Triphasic, normal flow noted in all other vessels.  See table(s) above for measurements and observations. Electronically signed by Sherald Hess MD on 04/18/2020 at 12:48:51 PM.    Final     Assessment/Plan Active Problems:   Acute on chronic respiratory failure with hypoxia (HCC)   COVID-19 virus infection   Pneumonia due to COVID-19 virus   COPD, severe (HCC)   Obstructive sleep apnea   1. Acute on chronic respiratory failure hypoxia continue to wean oxygen as tolerated doing much better now. 2. COVID-19 virus infection in recovery phase. 3. Pneumonia due to COVID-19 treated clinically is improved. 4. Severe COPD at baseline medical management 5. Obstructive sleep apnea doing fine we will continue to monitor 6. DVT discussed with primary care physician.   I have personally seen and evaluated the patient, evaluated laboratory and imaging results, formulated the assessment and plan and placed orders. The Patient requires high complexity decision making with multiple systems involvement.  Rounds were done with the Respiratory Therapy Director and Staff therapists and discussed with nursing staff also.  Yevonne Pax, MD [facility] Pulmonary Critical Care Medicine Sleep Medicine

## 2020-04-18 NOTE — Progress Notes (Signed)
Lower arterial duplex       has been completed. Preliminary results can be found under CV proc through chart review. Jeb Levering, BS, RDMS, RVT

## 2020-04-19 DIAGNOSIS — J449 Chronic obstructive pulmonary disease, unspecified: Secondary | ICD-10-CM | POA: Diagnosis not present

## 2020-04-19 DIAGNOSIS — J9621 Acute and chronic respiratory failure with hypoxia: Secondary | ICD-10-CM | POA: Diagnosis not present

## 2020-04-19 DIAGNOSIS — G4733 Obstructive sleep apnea (adult) (pediatric): Secondary | ICD-10-CM | POA: Diagnosis not present

## 2020-04-19 DIAGNOSIS — U071 COVID-19: Secondary | ICD-10-CM | POA: Diagnosis not present

## 2020-04-19 LAB — BASIC METABOLIC PANEL
Anion gap: 10 (ref 5–15)
BUN: 31 mg/dL — ABNORMAL HIGH (ref 6–20)
CO2: 21 mmol/L — ABNORMAL LOW (ref 22–32)
Calcium: 9.1 mg/dL (ref 8.9–10.3)
Chloride: 103 mmol/L (ref 98–111)
Creatinine, Ser: 0.83 mg/dL (ref 0.61–1.24)
GFR calc Af Amer: >60 mL/min (ref >60)
GFR calc non Af Amer: >60 mL/min (ref >60)
Glucose, Bld: 289 mg/dL — ABNORMAL HIGH (ref 70–99)
Potassium: 4.6 mmol/L (ref 3.5–5.1)
Sodium: 134 mmol/L — ABNORMAL LOW (ref 135–145)

## 2020-04-19 NOTE — Progress Notes (Signed)
Pulmonary Critical Care Medicine [place]   PULMONARY CRITICAL CARE SERVICE  PROGRESS NOTE  Date of Service: 04/19/2020  Gerald Sanders  JJK:[id number]  DOB: 24-Jun-1961   DOA: 04/10/2020  Referring Physician: Merton Border, MD  HPI: Gerald Sanders is a 59 y.o. male seen for follow up of Acute on Chronic Respiratory Failure.  Currently is on 2 L oxygen good saturations are noted.  We will gradually continue to wean FiO2 down  Medications: Reviewed on Rounds  Physical Exam:  Vitals: Temperature is 97.2 pulse 70 respiratory rate 16 blood pressure is 128/82 saturations 97%  Ventilator Settings on 2 L O2  . General: Comfortable at this time . Eyes: Grossly normal lids, irises & conjunctiva . ENT: grossly tongue is normal . Neck: no obvious mass . Cardiovascular: S1 S2 normal no gallop . Respiratory: No rhonchi no rales are noted at this time . Abdomen: soft . Skin: no rash seen on limited exam . Musculoskeletal: not rigid . Psychiatric:unable to assess . Neurologic: no seizure no involuntary movements         Lab Data:   Basic Metabolic Panel: Recent Labs  Lab 04/17/20 1818  NA 130*  K 4.8  CL 99  CO2 22  GLUCOSE 204*  BUN 35*  CREATININE 0.89  CALCIUM 9.7  MG 2.4    ABG: No results for input(s): PHART, PCO2ART, PO2ART, HCO3, O2SAT in the last 168 hours.  Liver Function Tests: No results for input(s): AST, ALT, ALKPHOS, BILITOT, PROT, ALBUMIN in the last 168 hours. No results for input(s): LIPASE, AMYLASE in the last 168 hours. No results for input(s): AMMONIA in the last 168 hours.  CBC: No results for input(s): WBC, NEUTROABS, HGB, HCT, MCV, PLT in the last 168 hours.  Cardiac Enzymes: No results for input(s): CKTOTAL, CKMB, CKMBINDEX, TROPONINI in the last 168 hours.  BNP (last 3 results) No results for input(s): BNP in the last 8760 hours.  ProBNP (last 3 results) No results for input(s): PROBNP in the last 8760  hours.  Radiological Exams: VAS [place] LOWER EXTREMITY ARTERIAL DUPLEX  Result Date: 04/18/2020 LOWER EXTREMITY ARTERIAL DUPLEX STUDY Indications: COVID with dusky toes. Other Factors: Leg pain.  Current ABI: 04/13/20 noncompressible ABIs Performing Technologist: June Leap RDMS, RVT  Examination Guidelines: A complete evaluation includes B-mode imaging, spectral Doppler, color Doppler, and power Doppler as needed of all accessible portions of each vessel. Bilateral testing is considered an integral part of a complete examination. Limited examinations for reoccurring indications may be performed as noted.  +-----------+--------+-----+--------+---------+--------+ RIGHT      PSV cm/sRatioStenosisWaveform Comments +-----------+--------+-----+--------+---------+--------+ CFA Prox   66                   triphasic         +-----------+--------+-----+--------+---------+--------+ DFA        183                  triphasic         +-----------+--------+-----+--------+---------+--------+ SFA Prox   76                   triphasic         +-----------+--------+-----+--------+---------+--------+ SFA Mid    83                   triphasic         +-----------+--------+-----+--------+---------+--------+ SFA Distal 73  triphasic         +-----------+--------+-----+--------+---------+--------+ POP Prox   40                   triphasic         +-----------+--------+-----+--------+---------+--------+ POP Distal 45                   triphasic         +-----------+--------+-----+--------+---------+--------+ ATA Prox   18                   biphasic          +-----------+--------+-----+--------+---------+--------+ ATA Mid                 occluded                  +-----------+--------+-----+--------+---------+--------+ ATA Distal              occluded                  +-----------+--------+-----+--------+---------+--------+ PTA Distal 49                    triphasic         +-----------+--------+-----+--------+---------+--------+ PERO Distal18                   triphasic         +-----------+--------+-----+--------+---------+--------+ DP         18                                     +-----------+--------+-----+--------+---------+--------+  +-----------+--------+-----+--------+----------+--------------+ LEFT       PSV cm/sRatioStenosisWaveform  Comments       +-----------+--------+-----+--------+----------+--------------+ CFA Prox   65                   triphasic                +-----------+--------+-----+--------+----------+--------------+ DFA        79                   triphasic                +-----------+--------+-----+--------+----------+--------------+ SFA Prox   91                   triphasic                +-----------+--------+-----+--------+----------+--------------+ SFA Mid    77                   triphasic                +-----------+--------+-----+--------+----------+--------------+ SFA Distal 102                  triphasic                +-----------+--------+-----+--------+----------+--------------+ POP Prox   53                   triphasic                +-----------+--------+-----+--------+----------+--------------+ POP Distal 41                   triphasic                +-----------+--------+-----+--------+----------+--------------+ ATA Distal 51  triphasic                +-----------+--------+-----+--------+----------+--------------+ PTA Prox                occluded                         +-----------+--------+-----+--------+----------+--------------+ PTA Mid    8                    monophasicnear occlusion +-----------+--------+-----+--------+----------+--------------+ PTA Distal 9                    monophasicnear occlusion +-----------+--------+-----+--------+----------+--------------+ PERO Distal27                    triphasic                +-----------+--------+-----+--------+----------+--------------+   Summary: Right: 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 Triphasic, normal flow noted in all other vessels. Left: Total occlusion noted in the posterior tibial artery. Triphasic, normal flow noted in all other vessels.  See table(s) above for measurements and observations. Electronically signed by Sherald Hess MD on 04/18/2020 at 12:48:51 PM.    Final     Assessment/Plan Active Problems:   Acute on chronic respiratory failure with hypoxia (HCC)   COVID-19 virus infection   Pneumonia due to COVID-19 virus   COPD, severe (HCC)   Obstructive sleep apnea   1. Acute on chronic respiratory failure hypoxia continue wean FiO2 patient saturations improving slowly. 2. COVID-19 virus infection treated in resolution phase 3. Pneumonia due to COVID-19 improving 4. Severe COPD medical management 5. Obstructive sleep apnea monitoring saturations no desaturation reported to me so far   I have personally seen and evaluated the patient, evaluated laboratory and imaging results, formulated the assessment and plan and placed orders. The Patient requires high complexity decision making with multiple systems involvement.  Rounds were done with the Respiratory Therapy Director and Staff therapists and discussed with nursing staff also.  Yevonne Pax, MD [facility] Pulmonary Critical Care Medicine Sleep Medicine

## 2020-04-20 DIAGNOSIS — J9621 Acute and chronic respiratory failure with hypoxia: Secondary | ICD-10-CM | POA: Diagnosis not present

## 2020-04-20 DIAGNOSIS — M79675 Pain in left toe(s): Secondary | ICD-10-CM

## 2020-04-20 DIAGNOSIS — G4733 Obstructive sleep apnea (adult) (pediatric): Secondary | ICD-10-CM | POA: Diagnosis not present

## 2020-04-20 DIAGNOSIS — Z8616 Personal history of COVID-19: Secondary | ICD-10-CM

## 2020-04-20 DIAGNOSIS — U071 COVID-19: Secondary | ICD-10-CM | POA: Diagnosis not present

## 2020-04-20 DIAGNOSIS — M79674 Pain in right toe(s): Secondary | ICD-10-CM

## 2020-04-20 DIAGNOSIS — J449 Chronic obstructive pulmonary disease, unspecified: Secondary | ICD-10-CM

## 2020-04-20 LAB — BASIC METABOLIC PANEL
Anion gap: 12 (ref 5–15)
BUN: 30 mg/dL — ABNORMAL HIGH (ref 6–20)
CO2: 20 mmol/L — ABNORMAL LOW (ref 22–32)
Calcium: 9 mg/dL (ref 8.9–10.3)
Chloride: 101 mmol/L (ref 98–111)
Creatinine, Ser: 0.85 mg/dL (ref 0.61–1.24)
GFR calc Af Amer: >60 mL/min (ref >60)
GFR calc non Af Amer: >60 mL/min (ref >60)
Glucose, Bld: 348 mg/dL — ABNORMAL HIGH (ref 70–99)
Potassium: 4.5 mmol/L (ref 3.5–5.1)
Sodium: 133 mmol/L — ABNORMAL LOW (ref 135–145)

## 2020-04-20 LAB — MAGNESIUM: Magnesium: 2.2 mg/dL (ref 1.7–2.4)

## 2020-04-20 NOTE — Progress Notes (Signed)
Pulmonary Critical Care Medicine [facility] [place]   PULMONARY CRITICAL CARE SERVICE  PROGRESS NOTE  Date of Service: 04/20/2020  [name]  JKK:[id number]  DOB: 07-19-1961   DOA: 04/10/2020  Referring Physician: Carron Curie, MD  HPI: Gerald Sanders is a 59 y.o. male seen for follow up of Acute on Chronic Respiratory Failure.  Patient right now is on 2 L of oxygen doing very well with gradually weaning FiO2 down  Medications: Reviewed on Rounds  Physical Exam:  Vitals: Temperature is 96.8 pulse 113 respiratory 16 blood pressure is 102/78 saturations 97%  Ventilator Settings on 2 L O2  . General: Comfortable at this time . Eyes: Grossly normal lids, irises & conjunctiva . ENT: grossly tongue is normal . Neck: no obvious mass . Cardiovascular: S1 S2 normal no gallop . Respiratory: No rhonchi coarse breath sounds . Abdomen: soft . Skin: no rash seen on limited exam . Musculoskeletal: not rigid . Psychiatric:unable to assess . Neurologic: no seizure no involuntary movements         Lab Data:   Basic Metabolic Panel: Recent Labs  Lab 04/17/20 1818 04/19/20 1546 04/20/20 1125 04/20/20 1330  NA 130* 134* 133*  --   K 4.8 4.6 4.5  --   CL 99 103 101  --   CO2 22 21* 20*  --   GLUCOSE 204* 289* 348*  --   BUN 35* 31* 30*  --   CREATININE 0.89 0.83 0.85  --   CALCIUM 9.7 9.1 9.0  --   MG 2.4  --   --  2.2    ABG: No results for input(s): PHART, PCO2ART, PO2ART, HCO3, O2SAT in the last 168 hours.  Liver Function Tests: No results for input(s): AST, ALT, ALKPHOS, BILITOT, PROT, ALBUMIN in the last 168 hours. No results for input(s): LIPASE, AMYLASE in the last 168 hours. No results for input(s): AMMONIA in the last 168 hours.  CBC: No results for input(s): WBC, NEUTROABS, HGB, HCT, MCV, PLT in the last 168 hours.  Cardiac Enzymes: No results for input(s): CKTOTAL, CKMB, CKMBINDEX, TROPONINI in the last 168 hours.  BNP (last 3 results) No results  for input(s): BNP in the last 8760 hours.  ProBNP (last 3 results) No results for input(s): PROBNP in the last 8760 hours.  Radiological Exams: No results found.  Assessment/Plan Active Problems:   Acute on chronic respiratory failure with hypoxia (HCC)   COVID-19 virus infection   Pneumonia due to COVID-19 virus   COPD, severe (HCC)   Obstructive sleep apnea   1. Acute on chronic respiratory failure with hypoxia we will continue with pressure assist control titrate oxygen continue pulmonary toilet. 2. COVID-19 virus infection in recovery phase we will continue to follow along. 3. Pneumonia due to COVID-19 treated clinically is improving 4. Severe COPD at baseline we will continue to follow 5. Obstructive sleep apnea doing well no desaturations reported   I have personally seen and evaluated the patient, evaluated laboratory and imaging results, formulated the assessment and plan and placed orders. The Patient requires high complexity decision making with multiple systems involvement.  Rounds were done with the Respiratory Therapy Director and Staff therapists and discussed with nursing staff also.  Yevonne Pax, MD [facility] Pulmonary Critical Care Medicine Sleep Medicine

## 2020-04-20 NOTE — Consult Note (Addendum)
Hospital Consult    Reason for Consult:  Toe discoloration Requesting Physician:  [facility] MRN #:  [id number]  History of Present Illness: This is a 59 y.o. male with chronic respiratory failure having been diagnosed with [facility]id [facility]  He is being seen in consultation for evaluation of toe discoloration of bilateral lower extremities.  Work-up has included a arterial duplex demonstrating tibial vessel disease however preserved ABIs.  He complains of great toe pain of bilateral lower extremities that started a couple days [facility].  He believes discoloration of left great toe is improving however both great toes remain painful.  He denies any pain in his toes prior to COVID-19 infection.  He does have history of unsteady gait and several falls however denies any rest pain or nonhealing wounds of bilateral lower extremities.  Abdominal CT results from April 2020 at a [facility] were negative for any aneurysmal disease abdominal aorta.  He is not on any blood thinners.  Past medical history also significant for severe COPD.  Past Medical History:  Diagnosis Date  . Acute on chronic respiratory failure with hypoxia ([place])   . COPD, severe ([place])   . COVID-19 virus infection   . Obstructive sleep apnea   . Pneumonia due to COVID-19 virus     Not on File  Prior to Admission medications   Not on File    Social History   Socioeconomic History  . Marital status: Married    Spouse name: Not on file  . Number of children: Not on file  . Years of education: Not on file  . Highest education level: Not on file  Occupational History  . Not on file  Tobacco Use  . Smoking status: Not on file  Substance and Sexual Activity  . Alcohol use: Not on file  . Drug use: Not on file  . Sexual activity: Not on file  Other Topics Concern  . Not on file  Social History Narrative  . Not on file   Social Determinants of  Health   Financial Resource Strain:   . Difficulty of Paying Living Expenses:   Food Insecurity:   . Worried About Charity fundraiser in the Last Year:   . Arboriculturist in the Last Year:   Transportation Needs:   . Film/video editor (Medical):   Marland Kitchen Lack of Transportation (Non-Medical):   Physical Activity:   . Days of Exercise per Week:   . Minutes of Exercise per Session:   Stress:   . Feeling of Stress :   Social Connections:   . Frequency of Communication with Friends and Family:   . Frequency of Social Gatherings with Friends and Family:   . Attends Religious Services:   . Active Member of Clubs or Organizations:   . Attends Archivist Meetings:   Marland Kitchen Marital Status:   Intimate Partner Violence:   . Fear of Current or Ex-Partner:   . Emotionally Abused:   Marland Kitchen Physically Abused:   . Sexually Abused:      No family history on file.  ROS: Otherwise negative unless mentioned in HPI  Physical Examination  There were no vitals filed for this visit. There is no height or weight on file to calculate BMI.  General:  WDWN in NAD Gait: Not observed HENT: WNL, normocephalic Pulmonary: Nonlabored on 3 L by [place] Cardiac: Tachycardic  Abdomen:  soft, NT/ND, no masses Skin:  without rashes Vascular Exam/Pulses: 2+ right PT pulse; 2+ left popliteal pulse and 1+ left anterior tibial artery pulse Extremities: Slight purplish discoloration of right great toe however good capillary refill; purplish discoloration of left second and great toe and plantar base of great toe; toes are painful to palpation bilaterally Musculoskeletal: no muscle wasting or atrophy  Neurologic: A&O X 3;  No focal weakness or paresthesias are detected; speech is fluent/normal Psychiatric:  The pt has Normal affect. Lymph:  Unremarkable  CBC    Component Value Date/Time   WBC 13.0 (H) 04/11/2020 1012   RBC 5.42 04/11/2020 1012   HGB 13.2 04/11/2020 1012   HCT 45.1 04/11/2020 1012   PLT 148  (L) 04/11/2020 1012   MCV 83.2 04/11/2020 1012   MCH 24.4 (L) 04/11/2020 1012   MCHC 29.3 (L) 04/11/2020 1012   RDW 20.2 (H) 04/11/2020 1012    BMET    Component Value Date/Time   NA 134 (L) 04/19/2020 1546   K 4.6 04/19/2020 1546   CL 103 04/19/2020 1546   CO2 21 (L) 04/19/2020 1546   GLUCOSE 289 (H) 04/19/2020 1546   BUN 31 (H) 04/19/2020 1546   CREATININE 0.83 04/19/2020 1546   CALCIUM 9.1 04/19/2020 1546   GFRNONAA >60 04/19/2020 1546   GFRAA >60 04/19/2020 1546    COAGS: No results found for: INR, PROTIME   Non-Invasive Vascular Imaging:   Triphasic waveforms throughout right lower extremity with total occlusion of anterior tibial artery Triphasic waveforms throughout left lower extremity with total occlusion of posterior tibial artery    ASSESSMENT/PLAN: This is a 59 y.o. male with painful toe discoloration of bilateral lower extremities  -Bilateral lower extremities appear to be adequately perfused with motor and sensation intact and palpable right PT as well as a faintly palpable left anterior tibial artery -He does have painful discoloration of both great toes and embolic event related to COVID-19 cannot be ruled out -We will likely need to allow time for tissue demarcation however no indication based on my physical exam for urgent or emergent intervention -On-call vascular surgeon Dr. Arbie Cookey will evaluate the patient later today and provide further treatment plans   Emilie Rutter PA-C [facility] [phone]  I have examined the patient, reviewed and agree with above.  Does have pain in his left great toe more than his right great toe.  Mild duskiness on the right and more on the left great toe.  Does not appear that this will be full-thickness tissue loss.  No ischemia to his feet bilaterally.  Would continue expectantly.  Would recommend aspirin therapy if he is not already on it and there is no contraindication.  We will not follow  actively.  Please call if we can assist  Gretta Began, MD 04/20/2020 1:13 PM

## 2020-04-21 DIAGNOSIS — G4733 Obstructive sleep apnea (adult) (pediatric): Secondary | ICD-10-CM | POA: Diagnosis not present

## 2020-04-21 DIAGNOSIS — J9621 Acute and chronic respiratory failure with hypoxia: Secondary | ICD-10-CM | POA: Diagnosis not present

## 2020-04-21 DIAGNOSIS — U071 COVID-19: Secondary | ICD-10-CM | POA: Diagnosis not present

## 2020-04-21 DIAGNOSIS — J449 Chronic obstructive pulmonary disease, unspecified: Secondary | ICD-10-CM | POA: Diagnosis not present

## 2020-04-21 LAB — CBC
HCT: 30.5 % — ABNORMAL LOW (ref 39.0–52.0)
Hemoglobin: 9.7 g/dL — ABNORMAL LOW (ref 13.0–17.0)
MCH: 28.3 pg (ref 26.0–34.0)
MCHC: 31.8 g/dL (ref 30.0–36.0)
MCV: 88.9 fL (ref 80.0–100.0)
Platelets: 319 10*3/uL (ref 150–400)
RBC: 3.43 MIL/uL — ABNORMAL LOW (ref 4.22–5.81)
WBC: 17 10*3/uL — ABNORMAL HIGH (ref 4.0–10.5)
nRBC: 5 % — ABNORMAL HIGH (ref 0.0–0.2)

## 2020-04-21 LAB — BASIC METABOLIC PANEL
Anion gap: 7 (ref 5–15)
BUN: 25 mg/dL — ABNORMAL HIGH (ref 6–20)
CO2: 24 mmol/L (ref 22–32)
Calcium: 8.8 mg/dL — ABNORMAL LOW (ref 8.9–10.3)
Chloride: 106 mmol/L (ref 98–111)
Creatinine, Ser: 0.74 mg/dL (ref 0.61–1.24)
GFR calc Af Amer: >60 mL/min (ref >60)
GFR calc non Af Amer: >60 mL/min (ref >60)
Glucose, Bld: 165 mg/dL — ABNORMAL HIGH (ref 70–99)
Potassium: 4.5 mmol/L (ref 3.5–5.1)
Sodium: 137 mmol/L (ref 135–145)

## 2020-04-21 LAB — MAGNESIUM: Magnesium: 2.3 mg/dL (ref 1.7–2.4)

## 2020-04-21 LAB — LITHIUM LEVEL: Lithium Lvl: 0.88 mmol/L (ref 0.60–1.20)

## 2020-04-21 NOTE — Progress Notes (Signed)
Pulmonary Critical Care Medicine [facility] [place]   PULMONARY CRITICAL CARE SERVICE  PROGRESS NOTE  Date of Service: 04/21/2020  Gerald Sanders  RDE:[id number]  DOB: June 21, 1961   DOA: 04/10/2020  Referring Physician: Carron Curie, MD  HPI: Gerald Sanders is a 59 y.o. male seen for follow up of Acute on Chronic Respiratory Failure.  Patient is currently on 2 L oxygen with good saturations are noted  Medications: Reviewed on Rounds  Physical Exam:  Vitals: Temperature is 96.4 pulse 108 respiratory rate 12 blood pressure is 127/80 saturations 99%  Ventilator Settings on 2 L oxygen  . General: Comfortable at this time . Eyes: Grossly normal lids, irises & conjunctiva . ENT: grossly tongue is normal . Neck: no obvious mass . Cardiovascular: S1 S2 normal no gallop . Respiratory: No rhonchi coarse breath sounds . Abdomen: soft . Skin: no rash seen on limited exam . Musculoskeletal: not rigid . Psychiatric:unable to assess . Neurologic: no seizure no involuntary movements         Lab Data:   Basic Metabolic Panel: Recent Labs  Lab 04/17/20 1818 04/19/20 1546 04/20/20 1125 04/20/20 1330 04/21/20 0801  NA 130* 134* 133*  --  137  K 4.8 4.6 4.5  --  4.5  CL 99 103 101  --  106  CO2 22 21* 20*  --  24  GLUCOSE 204* 289* 348*  --  165*  BUN 35* 31* 30*  --  25*  CREATININE 0.89 0.83 0.85  --  0.74  CALCIUM 9.7 9.1 9.0  --  8.8*  MG 2.4  --   --  2.2 2.3    ABG: No results for input(s): PHART, PCO2ART, PO2ART, HCO3, O2SAT in the last 168 hours.  Liver Function Tests: No results for input(s): AST, ALT, ALKPHOS, BILITOT, PROT, ALBUMIN in the last 168 hours. No results for input(s): LIPASE, AMYLASE in the last 168 hours. No results for input(s): AMMONIA in the last 168 hours.  CBC: Recent Labs  Lab 04/21/20 0801  WBC 17.0*  HGB 9.7*  HCT 30.5*  MCV 88.9  PLT 319    Cardiac Enzymes: No results for input(s): CKTOTAL, CKMB, CKMBINDEX, TROPONINI in the  last 168 hours.  BNP (last 3 results) No results for input(s): BNP in the last 8760 hours.  ProBNP (last 3 results) No results for input(s): PROBNP in the last 8760 hours.  Radiological Exams: No results found.  Assessment/Plan Active Problems:   Acute on chronic respiratory failure with hypoxia (HCC)   COVID-19 virus infection   Pneumonia due to COVID-19 virus   COPD, severe (HCC)   Obstructive sleep apnea   1. Acute on chronic respiratory failure with hypoxia we will continue with the 2 L O2.  Continue secretion management supportive care.  Patient currently is short we will hold off on any improvement 2. COVID-19 virus patient resolved we will continue with supportive care 3. Pneumonia due to COVID-19 improving 4. Severe COPD at baseline 5. Obstructive sleep apnea patient is at baseline we will continue to follow along.   I have personally seen and evaluated the patient, evaluated laboratory and imaging results, formulated the assessment and plan and placed orders. The Patient requires high complexity decision making with multiple systems involvement.  Rounds were done with the Respiratory Therapy Director and Staff therapists and discussed with nursing staff also.  Yevonne Pax, MD [facility] Pulmonary Critical Care Medicine Sleep Medicine

## 2020-04-22 DIAGNOSIS — J9621 Acute and chronic respiratory failure with hypoxia: Secondary | ICD-10-CM | POA: Diagnosis not present

## 2020-04-22 DIAGNOSIS — J449 Chronic obstructive pulmonary disease, unspecified: Secondary | ICD-10-CM | POA: Diagnosis not present

## 2020-04-22 DIAGNOSIS — U071 COVID-19: Secondary | ICD-10-CM | POA: Diagnosis not present

## 2020-04-22 DIAGNOSIS — G4733 Obstructive sleep apnea (adult) (pediatric): Secondary | ICD-10-CM | POA: Diagnosis not present

## 2020-04-22 LAB — BASIC METABOLIC PANEL
Anion gap: 8 (ref 5–15)
BUN: 25 mg/dL — ABNORMAL HIGH (ref 6–20)
CO2: 25 mmol/L (ref 22–32)
Calcium: 8.8 mg/dL — ABNORMAL LOW (ref 8.9–10.3)
Chloride: 101 mmol/L (ref 98–111)
Creatinine, Ser: 0.82 mg/dL (ref 0.61–1.24)
GFR calc Af Amer: >60 mL/min (ref >60)
GFR calc non Af Amer: >60 mL/min (ref >60)
Glucose, Bld: 194 mg/dL — ABNORMAL HIGH (ref 70–99)
Potassium: 5 mmol/L (ref 3.5–5.1)
Sodium: 134 mmol/L — ABNORMAL LOW (ref 135–145)

## 2020-04-22 NOTE — Progress Notes (Addendum)
Pulmonary Critical Care Medicine [facility] [place]   PULMONARY CRITICAL CARE SERVICE  PROGRESS NOTE  Date of Service: 04/22/2020  Gerald Sanders  PPI:[id number]  DOB: 06-Aug-1961   DOA: 04/10/2020  Referring Physician: Carron Curie, MD  HPI: Gerald Sanders is a 59 y.o. male seen for follow up of Acute on Chronic Respiratory Failure.  Patient remains on 2 L nasal cannula satting well with no distress.  Medications: Reviewed on Rounds  Physical Exam:  Vitals: Pulse 88 respirations 24 BP 138/86 O2 sat 100% temp 96.2  Ventilator Settings 2 L nasal cannula  . General: Comfortable at this time . Eyes: Grossly normal lids, irises & conjunctiva . ENT: grossly tongue is normal . Neck: no obvious mass . Cardiovascular: S1 S2 normal no gallop . Respiratory: No rales rhonchi noted . Abdomen: soft . Skin: no rash seen on limited exam . Musculoskeletal: not rigid . Psychiatric:unable to assess . Neurologic: no seizure no involuntary movements         Lab Data:   Basic Metabolic Panel: Recent Labs  Lab 04/17/20 1818 04/19/20 1546 04/20/20 1125 04/20/20 1330 04/21/20 0801 04/22/20 0513  NA 130* 134* 133*  --  137 134*  K 4.8 4.6 4.5  --  4.5 5.0  CL 99 103 101  --  106 101  CO2 22 21* 20*  --  24 25  GLUCOSE 204* 289* 348*  --  165* 194*  BUN 35* 31* 30*  --  25* 25*  CREATININE 0.89 0.83 0.85  --  0.74 0.82  CALCIUM 9.7 9.1 9.0  --  8.8* 8.8*  MG 2.4  --   --  2.2 2.3  --     ABG: No results for input(s): PHART, PCO2ART, PO2ART, HCO3, O2SAT in the last 168 hours.  Liver Function Tests: No results for input(s): AST, ALT, ALKPHOS, BILITOT, PROT, ALBUMIN in the last 168 hours. No results for input(s): LIPASE, AMYLASE in the last 168 hours. No results for input(s): AMMONIA in the last 168 hours.  CBC: Recent Labs  Lab 04/21/20 0801  WBC 17.0*  HGB 9.7*  HCT 30.5*  MCV 88.9  PLT 319    Cardiac Enzymes: No results for input(s): CKTOTAL, CKMB, CKMBINDEX,  TROPONINI in the last 168 hours.  BNP (last 3 results) No results for input(s): BNP in the last 8760 hours.  ProBNP (last 3 results) No results for input(s): PROBNP in the last 8760 hours.  Radiological Exams: No results found.  Assessment/Plan Active Problems:   Acute on chronic respiratory failure with hypoxia ([facility])   COVID-19 virus infection   Pneumonia due to COVID-19 virus   COPD, severe ([facility])   Obstructive sleep apnea   1. Acute on chronic respiratory failure with hypoxia we will continue with the 2 L O2.  Continue secretion management supportive care.  2. COVID-19 virus patient resolved we will continue with supportive care 3. Pneumonia due to COVID-19 improving 4. Severe COPD at baseline 5. Obstructive sleep apnea patient is at baseline we will continue to follow along.   I have personally seen and evaluated the patient, evaluated laboratory and imaging results, formulated the assessment and plan and placed orders. The Patient requires high complexity decision making with multiple systems involvement.  Rounds were done with the Respiratory Therapy Director and Staff therapists and discussed with nursing staff also.  Yevonne Pax, MD [facility] Pulmonary Critical Care Medicine Sleep Medicine

## 2020-04-23 DIAGNOSIS — J449 Chronic obstructive pulmonary disease, unspecified: Secondary | ICD-10-CM | POA: Diagnosis not present

## 2020-04-23 DIAGNOSIS — G4733 Obstructive sleep apnea (adult) (pediatric): Secondary | ICD-10-CM | POA: Diagnosis not present

## 2020-04-23 DIAGNOSIS — U071 COVID-19: Secondary | ICD-10-CM | POA: Diagnosis not present

## 2020-04-23 DIAGNOSIS — J9621 Acute and chronic respiratory failure with hypoxia: Secondary | ICD-10-CM | POA: Diagnosis not present

## 2020-04-23 NOTE — Progress Notes (Addendum)
Pulmonary Critical Care Medicine [facility] [place]   PULMONARY CRITICAL CARE SERVICE  PROGRESS NOTE  Date of Service: 04/23/2020  Gerald Sanders  OXB:[id number]  DOB: 1961/08/16   DOA: 04/10/2020  Referring Physician: Carron Curie, MD  HPI: Gerald Sanders is a 59 y.o. male seen for follow up of Acute on Chronic Respiratory Failure.  Patient remains on 2 L nasal cannula satting well no fever distress.  Medications: Reviewed on Rounds  Physical Exam:  Vitals: Pulse 84 respirations 14 BP 125/76 O2 sat 98% temp 96.7  Ventilator Settings 2 L nasal cannula  . General: Comfortable at this time . Eyes: Grossly normal lids, irises & conjunctiva . ENT: grossly tongue is normal . Neck: no obvious mass . Cardiovascular: S1 S2 normal no gallop . Respiratory: No rales or rhonchi noted . Abdomen: soft . Skin: no rash seen on limited exam . Musculoskeletal: not rigid . Psychiatric:unable to assess . Neurologic: no seizure no involuntary movements         Lab Data:   Basic Metabolic Panel: Recent Labs  Lab 04/17/20 1818 04/19/20 1546 04/20/20 1125 04/20/20 1330 04/21/20 0801 04/22/20 0513  NA 130* 134* 133*  --  137 134*  K 4.8 4.6 4.5  --  4.5 5.0  CL 99 103 101  --  106 101  CO2 22 21* 20*  --  24 25  GLUCOSE 204* 289* 348*  --  165* 194*  BUN 35* 31* 30*  --  25* 25*  CREATININE 0.89 0.83 0.85  --  0.74 0.82  CALCIUM 9.7 9.1 9.0  --  8.8* 8.8*  MG 2.4  --   --  2.2 2.3  --     ABG: No results for input(s): PHART, PCO2ART, PO2ART, HCO3, O2SAT in the last 168 hours.  Liver Function Tests: No results for input(s): AST, ALT, ALKPHOS, BILITOT, PROT, ALBUMIN in the last 168 hours. No results for input(s): LIPASE, AMYLASE in the last 168 hours. No results for input(s): AMMONIA in the last 168 hours.  CBC: Recent Labs  Lab 04/21/20 0801  WBC 17.0*  HGB 9.7*  HCT 30.5*  MCV 88.9  PLT 319    Cardiac Enzymes: No results for input(s): CKTOTAL, CKMB,  CKMBINDEX, TROPONINI in the last 168 hours.  BNP (last 3 results) No results for input(s): BNP in the last 8760 hours.  ProBNP (last 3 results) No results for input(s): PROBNP in the last 8760 hours.  Radiological Exams: No results found.  Assessment/Plan Active Problems:   Acute on chronic respiratory failure with hypoxia ([facility])   COVID-19 virus infection   Pneumonia due to COVID-19 virus   COPD, severe ([facility])   Obstructive sleep apnea   1. Acute on chronic respiratory failure with hypoxia we will continue with the 2 L O2.  Continue secretion management supportive care.   2. COVID-19 virus patient resolved we will continue with supportive care 3. Pneumonia due to COVID-19 improving 4. Severe COPD at baseline 5. Obstructive sleep apnea patient is at baseline we will continue to follow along.   I have personally seen and evaluated the patient, evaluated laboratory and imaging results, formulated the assessment and plan and placed orders. The Patient requires high complexity decision making with multiple systems involvement.  Rounds were done with the Respiratory Therapy Director and Staff therapists and discussed with nursing staff also.  Yevonne Pax, MD [facility] Pulmonary Critical Care Medicine Sleep Medicine

## 2020-04-24 DIAGNOSIS — J449 Chronic obstructive pulmonary disease, unspecified: Secondary | ICD-10-CM | POA: Diagnosis not present

## 2020-04-24 DIAGNOSIS — U071 COVID-19: Secondary | ICD-10-CM | POA: Diagnosis not present

## 2020-04-24 DIAGNOSIS — J9621 Acute and chronic respiratory failure with hypoxia: Secondary | ICD-10-CM | POA: Diagnosis not present

## 2020-04-24 DIAGNOSIS — G4733 Obstructive sleep apnea (adult) (pediatric): Secondary | ICD-10-CM | POA: Diagnosis not present

## 2020-04-24 LAB — CBC
HCT: 34.2 % — ABNORMAL LOW (ref 39.0–52.0)
Hemoglobin: 10.6 g/dL — ABNORMAL LOW (ref 13.0–17.0)
MCH: 29.5 pg (ref 26.0–34.0)
MCHC: 31 g/dL (ref 30.0–36.0)
MCV: 95.3 fL (ref 80.0–100.0)
Platelets: 271 10*3/uL (ref 150–400)
RBC: 3.59 MIL/uL — ABNORMAL LOW (ref 4.22–5.81)
WBC: 11.5 10*3/uL — ABNORMAL HIGH (ref 4.0–10.5)
nRBC: 0.4 % — ABNORMAL HIGH (ref 0.0–0.2)

## 2020-04-24 LAB — MAGNESIUM: Magnesium: 2.3 mg/dL (ref 1.7–2.4)

## 2020-04-24 LAB — BASIC METABOLIC PANEL
Anion gap: 7 (ref 5–15)
BUN: 27 mg/dL — ABNORMAL HIGH (ref 6–20)
CO2: 23 mmol/L (ref 22–32)
Calcium: 9 mg/dL (ref 8.9–10.3)
Chloride: 106 mmol/L (ref 98–111)
Creatinine, Ser: 0.68 mg/dL (ref 0.61–1.24)
GFR calc Af Amer: >60 mL/min (ref >60)
GFR calc non Af Amer: >60 mL/min (ref >60)
Glucose, Bld: 132 mg/dL — ABNORMAL HIGH (ref 70–99)
Potassium: 4.5 mmol/L (ref 3.5–5.1)
Sodium: 136 mmol/L (ref 135–145)

## 2020-04-24 LAB — LITHIUM LEVEL: Lithium Lvl: 0.73 mmol/L (ref 0.60–1.20)

## 2020-04-24 NOTE — Progress Notes (Addendum)
Pulmonary Critical Care Medicine [facility] [place]   PULMONARY CRITICAL CARE SERVICE  PROGRESS NOTE  Date of Service: 04/24/2020  [name]  XLK:[id number]  DOB: 06-27-1961   DOA: 04/10/2020  Referring Physician: Carron Curie, MD  HPI: Gerald Sanders is a 59 y.o. male seen for follow up of Acute on Chronic Respiratory Failure.  Patient mains on 2 L nasal cannula doing well at this time satting well no distress.  Medications: Reviewed on Rounds  Physical Exam:  Vitals: Pulse 76 respirations 16 BP 106/83 O2 sat 97% temp 96.6  Ventilator Settings 2 L nasal cannula  . General: Comfortable at this time . Eyes: Grossly normal lids, irises & conjunctiva . ENT: grossly tongue is normal . Neck: no obvious mass . Cardiovascular: S1 S2 normal no gallop . Respiratory: No rales or rhonchi noted . Abdomen: soft . Skin: no rash seen on limited exam . Musculoskeletal: not rigid . Psychiatric:unable to assess . Neurologic: no seizure no involuntary movements         Lab Data:   Basic Metabolic Panel: Recent Labs  Lab 04/17/20 1818 04/17/20 1818 04/19/20 1546 04/20/20 1125 04/20/20 1330 04/21/20 0801 04/22/20 0513 04/24/20 0555  NA 130*   < > 134* 133*  --  137 134* 136  K 4.8   < > 4.6 4.5  --  4.5 5.0 4.5  CL 99   < > 103 101  --  106 101 106  CO2 22   < > 21* 20*  --  24 25 23   GLUCOSE 204*   < > 289* 348*  --  165* 194* 132*  BUN 35*   < > 31* 30*  --  25* 25* 27*  CREATININE 0.89   < > 0.83 0.85  --  0.74 0.82 0.68  CALCIUM 9.7   < > 9.1 9.0  --  8.8* 8.8* 9.0  MG 2.4  --   --   --  2.2 2.3  --  2.3   < > = values in this interval not displayed.    ABG: No results for input(s): PHART, PCO2ART, PO2ART, HCO3, O2SAT in the last 168 hours.  Liver Function Tests: No results for input(s): AST, ALT, ALKPHOS, BILITOT, PROT, ALBUMIN in the last 168 hours. No results for input(s): LIPASE, AMYLASE in the last 168 hours. No results for input(s): AMMONIA in the  last 168 hours.  CBC: Recent Labs  Lab 04/21/20 0801 04/24/20 0555  WBC 17.0* 11.5*  HGB 9.7* 10.6*  HCT 30.5* 34.2*  MCV 88.9 95.3  PLT 319 271    Cardiac Enzymes: No results for input(s): CKTOTAL, CKMB, CKMBINDEX, TROPONINI in the last 168 hours.  BNP (last 3 results) No results for input(s): BNP in the last 8760 hours.  ProBNP (last 3 results) No results for input(s): PROBNP in the last 8760 hours.  Radiological Exams: No results found.  Assessment/Plan Active Problems:   Acute on chronic respiratory failure with hypoxia (H[facility])   COVID-19 virus infection   Pneumonia due to COVID-19 virus   COPD, severe (H[facility])   Obstructive sleep apnea   1. Acute on chronic respiratory failure with hypoxia we will continue with the 2 L O2. Continue secretion management supportive care.  2. COVID-19 virus patient resolved we will continue with supportive care 3. Pneumonia due to COVID-19 improving 4. Severe COPD at baseline 5. Obstructive sleep apnea patient is at baseline we will continue to follow along.   I have personally seen and  evaluated the patient, evaluated laboratory and imaging results, formulated the assessment and plan and placed orders. The Patient requires high complexity decision making with multiple systems involvement.  Rounds were done with the Respiratory Therapy Director and Staff therapists and discussed with nursing staff also.  Allyne Gee, MD [facility] Pulmonary Critical Care Medicine Sleep Medicine

## 2020-04-28 LAB — CBC
HCT: 35.5 % — ABNORMAL LOW (ref 39.0–52.0)
Hemoglobin: 11.1 g/dL — ABNORMAL LOW (ref 13.0–17.0)
MCH: 31.2 pg (ref 26.0–34.0)
MCHC: 31.3 g/dL (ref 30.0–36.0)
MCV: 99.7 fL (ref 80.0–100.0)
Platelets: 257 10*3/uL (ref 150–400)
RBC: 3.56 MIL/uL — ABNORMAL LOW (ref 4.22–5.81)
WBC: 11.4 10*3/uL — ABNORMAL HIGH (ref 4.0–10.5)
nRBC: 0 % (ref 0.0–0.2)

## 2020-04-28 LAB — RENAL FUNCTION PANEL
Albumin: 3.2 g/dL — ABNORMAL LOW (ref 3.5–5.0)
Anion gap: 10 (ref 5–15)
BUN: 23 mg/dL — ABNORMAL HIGH (ref 6–20)
CO2: 19 mmol/L — ABNORMAL LOW (ref 22–32)
Calcium: 9.1 mg/dL (ref 8.9–10.3)
Chloride: 109 mmol/L (ref 98–111)
Creatinine, Ser: 0.69 mg/dL (ref 0.61–1.24)
GFR calc Af Amer: >60 mL/min (ref >60)
GFR calc non Af Amer: >60 mL/min (ref >60)
Glucose, Bld: 102 mg/dL — ABNORMAL HIGH (ref 70–99)
Phosphorus: 3.8 mg/dL (ref 2.5–4.6)
Potassium: 4.3 mmol/L (ref 3.5–5.1)
Sodium: 138 mmol/L (ref 135–145)

## 2020-04-28 LAB — MAGNESIUM: Magnesium: 2.3 mg/dL (ref 1.7–2.4)

## 2020-04-30 LAB — SARS CORONAVIRUS 2 (TAT 6-24 HRS): SARS Coronavirus 2: POSITIVE — AB

## 2020-05-01 LAB — NOVEL CORONAVIRUS, NAA (HOSP ORDER, SEND-OUT TO REF LAB; TAT 18-24 HRS): SARS-CoV-2, NAA: DETECTED — AB

## 2020-11-07 IMAGING — DX DG CHEST 1V PORT
1 series · 1 of 1 positions shown · non-contrast
Comparison: None available

CLINICAL DATA: Respiratory failure

EXAM:
PORTABLE CHEST - 1 VIEW

[chest ap]
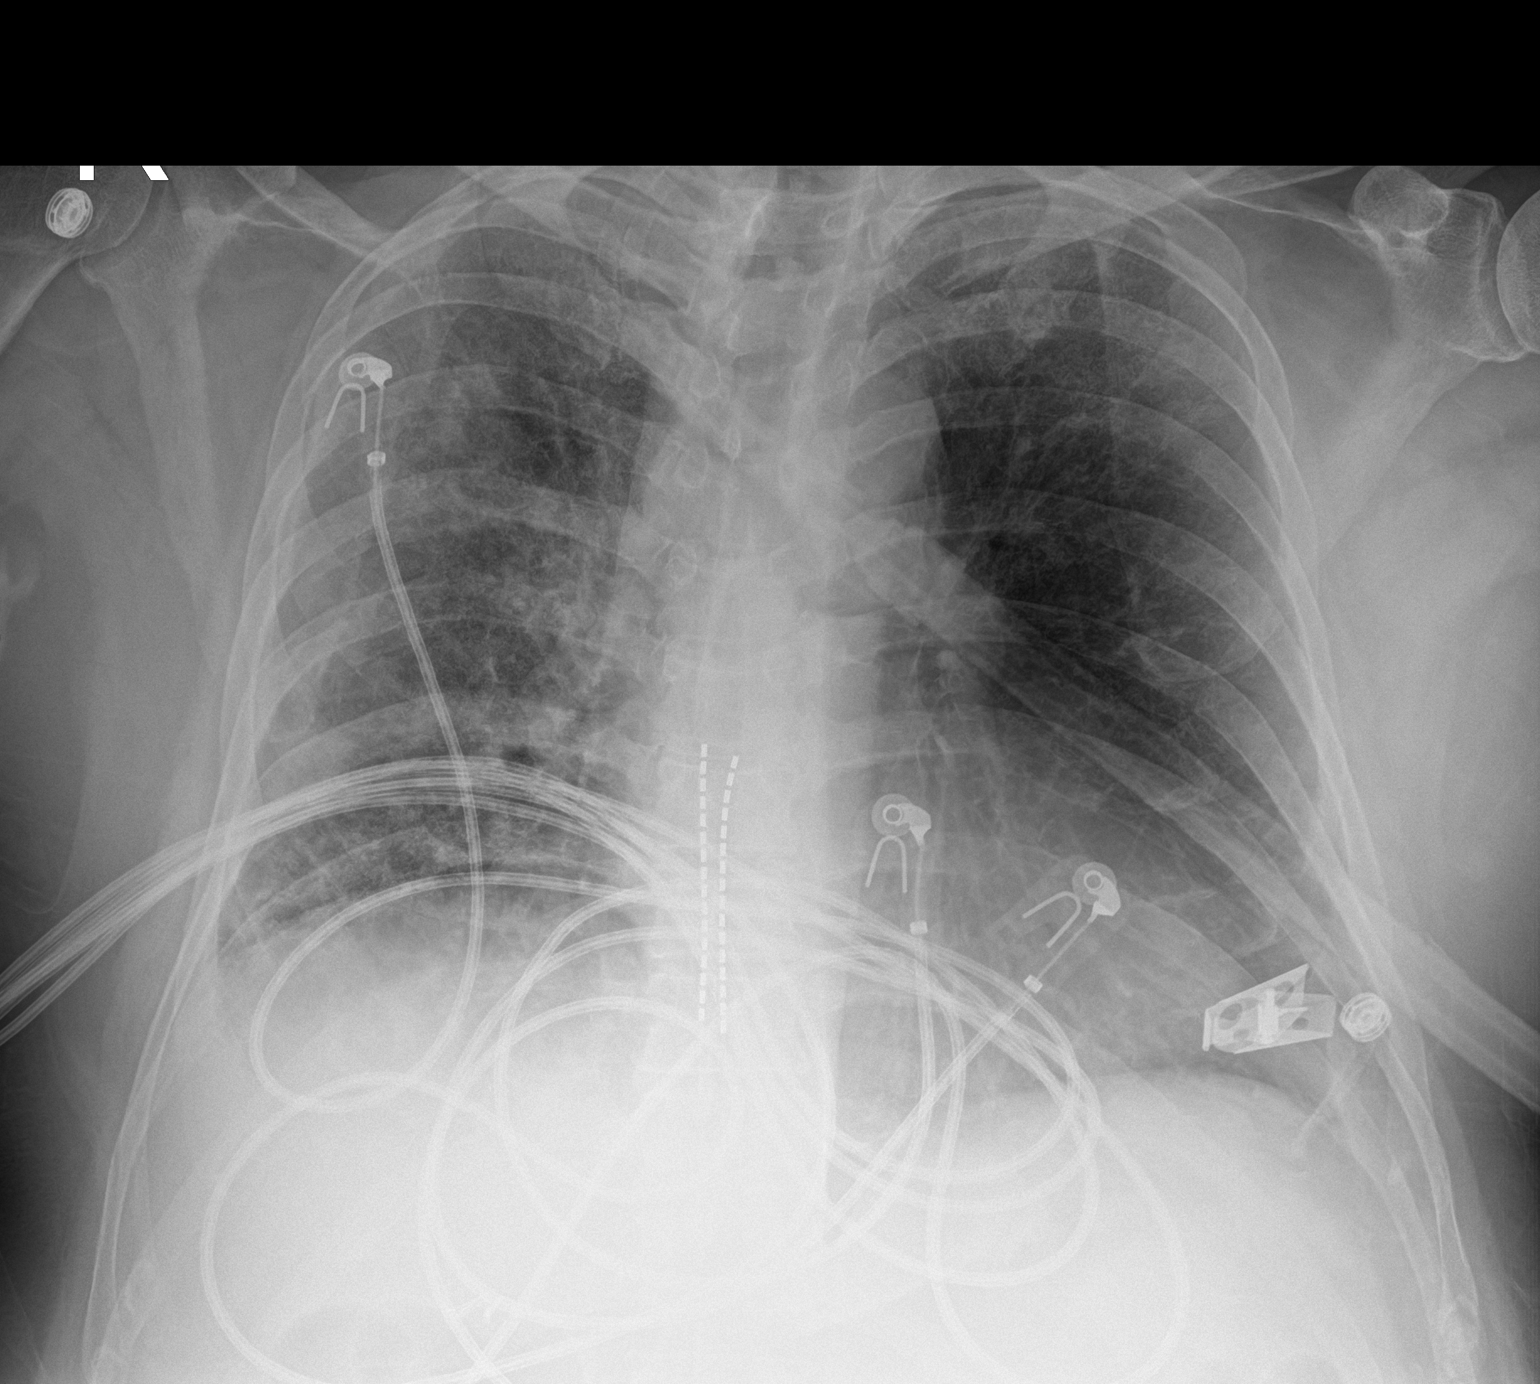

[1 of 1 positions shown; findings below may reference images not displayed]

FINDINGS: Patchy airspace opacities scattered throughout the right lung. Hazy
alveolar opacities at the left apex.

Heart size normal for technique. No definite effusion. No
pneumothorax.

Dorsal epidural stimulator catheters extend up to the T7-8
interspace. Regional bones unremarkable.
IMPRESSION: Asymmetric airspace disease, right greater than left.
